# Patient Record
Sex: Male | Born: 1990 | Race: White | Hispanic: No | Marital: Single | Smoking: Never smoker
Health system: Southern US, Community
[De-identification: ages and names within clinical notes are randomized; demographics above are authoritative.]

## PROBLEM LIST (undated history)

## (undated) DIAGNOSIS — I83891 Varicose veins of right lower extremities with other complications: Secondary | ICD-10-CM

## (undated) HISTORY — PX: LASER ABLATION: SHX1947

## (undated) HISTORY — DX: Varicose veins of right lower extremity with other complications: I83.891

---

## 2009-06-02 ENCOUNTER — Emergency Department (HOSPITAL_COMMUNITY): Admission: EM | Admit: 2009-06-02 | Discharge: 2009-06-03 | Payer: Self-pay | Admitting: Emergency Medicine
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
## 2016-05-22 ENCOUNTER — Encounter: Payer: Self-pay | Admitting: Vascular Surgery

## 2016-06-01 ENCOUNTER — Telehealth: Payer: Self-pay | Admitting: Vascular Surgery

## 2016-06-01 ENCOUNTER — Encounter: Payer: Managed Care, Other (non HMO) | Admitting: Vascular Surgery

## 2016-06-01 NOTE — Telephone Encounter (Signed)
Per [employer] instruction, I called patient and left message for him to arrive at 1:30pm for his appt. Dr.Lawson has a meeting this afternoon and has asked that his patients come in earlier if possible. awt

## 2016-06-11 ENCOUNTER — Ambulatory Visit (HOSPITAL_COMMUNITY)
Admission: RE | Admit: 2016-06-11 | Discharge: 2016-06-11 | Disposition: A | Discharge status: Home / Self Care | Payer: Managed Care, Other (non HMO) | Source: Ambulatory Visit | Attending: Vascular Surgery | Admitting: Vascular Surgery

## 2016-06-11 ENCOUNTER — Encounter: Payer: Self-pay | Admitting: Vascular Surgery

## 2016-06-11 ENCOUNTER — Other Ambulatory Visit: Payer: Self-pay | Admitting: *Deleted

## 2016-06-11 ENCOUNTER — Ambulatory Visit (INDEPENDENT_AMBULATORY_CARE_PROVIDER_SITE_OTHER): Payer: Managed Care, Other (non HMO) | Admitting: Vascular Surgery

## 2016-06-11 VITALS — BP 130/81 | HR 67 | Temp 97.1°F | Resp 16 | Ht 72.0 in | Wt 186.0 lb

## 2016-06-11 DIAGNOSIS — I83899 Varicose veins of unspecified lower extremities with other complications: Secondary | ICD-10-CM | POA: Diagnosis not present

## 2016-06-11 DIAGNOSIS — I83811 Varicose veins of right lower extremities with pain: Secondary | ICD-10-CM | POA: Insufficient documentation
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
## 2016-06-16 ENCOUNTER — Other Ambulatory Visit: Payer: Self-pay | Admitting: *Deleted

## 2016-06-16 DIAGNOSIS — I83811 Varicose veins of right lower extremities with pain: Secondary | ICD-10-CM

## 2016-06-24 ENCOUNTER — Encounter: Payer: Self-pay | Admitting: Vascular Surgery

## 2016-06-25 ENCOUNTER — Encounter: Payer: Self-pay | Admitting: Vascular Surgery

## 2016-07-02 ENCOUNTER — Other Ambulatory Visit: Payer: Managed Care, Other (non HMO) | Admitting: Vascular Surgery

## 2016-07-07 ENCOUNTER — Ambulatory Visit: Payer: Managed Care, Other (non HMO) | Admitting: Vascular Surgery

## 2016-07-07 ENCOUNTER — Encounter (HOSPITAL_COMMUNITY): Payer: Managed Care, Other (non HMO)

## 2016-08-06 ENCOUNTER — Ambulatory Visit (INDEPENDENT_AMBULATORY_CARE_PROVIDER_SITE_OTHER): Payer: Managed Care, Other (non HMO) | Admitting: Vascular Surgery

## 2016-08-06 ENCOUNTER — Encounter: Payer: Self-pay | Admitting: Vascular Surgery

## 2016-08-06 VITALS — BP 136/85 | HR 78 | Temp 98.0°F | Resp 16 | Ht 72.0 in | Wt 180.0 lb

## 2016-08-06 DIAGNOSIS — I83899 Varicose veins of unspecified lower extremities with other complications: Secondary | ICD-10-CM

## 2016-08-06 DIAGNOSIS — I868 Varicose veins of other specified sites: Secondary | ICD-10-CM

## 2016-08-06 NOTE — Progress Notes (Signed)
     Laser Ablation Procedure    Date: 08/06/2016   Vincente PoliSamuel B Tow DOB:09-11-1990  Consent signed: Yes    Surgeon:  Dr. Tawanna Coolerodd Nevan Creighton  Procedure: Laser Ablation: right Small Saphenous Vein  BP 136/85   Pulse 78   Temp 98 F (36.7 C)   Resp 16   Ht 6' (1.829 m)   Wt 180 lb (81.6 kg)   SpO2 99%   BMI 24.41 kg/m   Tumescent Anesthesia: 380 cc 0.9% NaCl with 50 cc Lidocaine HCL with 1% Epi and 15 cc 8.4% NaHCO3  Local Anesthesia: 4 cc Lidocaine HCL and NaHCO3 (ratio 2:1)  15 watts continuous mode        Total energy: 1550   Total time: 1:43    Stab Phlebectomy: >20 Sites: Calf  Patient tolerated procedure well  Notes:   Description of Procedure:  After marking the course of the secondary varicosities, the patient was placed on the operating table in the prone position, and the right leg was prepped and draped in sterile fashion.   Local anesthetic was administered and under ultrasound guidance the saphenous vein was accessed with a micro needle and guide wire; then the mirco puncture sheath was placed.  A guide wire was inserted saphenopopliteal junction , followed by a 5 french sheath.  The position of the sheath and then the laser fiber below the junction was confirmed using the ultrasound.  Tumescent anesthesia was administered along the course of the saphenous vein using ultrasound guidance. The patient was placed in Trendelenburg position and protective laser glasses were placed on patient and staff, and the laser was fired at 15 watts continuous mode advancing 1-602mm/second for a total of 1550 joules.   For stab phlebectomies, local anesthetic was administered at the previously marked varicosities, and tumescent anesthesia was administered around the vessels.  Greater than 20 stab wounds were made using the tip of an 11 blade. And using the vein hook, the phlebectomies were performed using a hemostat to avulse the varicosities.  Adequate hemostasis was achieved.      Steri strips were applied to the stab wounds and ABD pads and thigh high compression stockings were applied.  Ace wrap bandages were applied over the phlebectomy sites and at the top of the saphenopopliteal junction. Blood loss was less than 15 cc.  The patient ambulated out of the operating room having tolerated the procedure well.

## 2016-08-07 ENCOUNTER — Encounter: Payer: Self-pay | Admitting: Vascular Surgery

## 2016-08-13 ENCOUNTER — Ambulatory Visit (HOSPITAL_COMMUNITY)
Admission: RE | Admit: 2016-08-13 | Discharge: 2016-08-13 | Disposition: A | Discharge status: Home / Self Care | Payer: Managed Care, Other (non HMO) | Source: Ambulatory Visit | Attending: Vascular Surgery | Admitting: Vascular Surgery

## 2016-08-13 ENCOUNTER — Encounter: Payer: Self-pay | Admitting: Vascular Surgery

## 2016-08-13 ENCOUNTER — Ambulatory Visit (INDEPENDENT_AMBULATORY_CARE_PROVIDER_SITE_OTHER): Payer: Managed Care, Other (non HMO) | Admitting: Vascular Surgery

## 2016-08-13 VITALS — BP 123/81 | HR 77 | Temp 97.9°F | Resp 20 | Ht 72.0 in | Wt 184.5 lb

## 2016-08-13 DIAGNOSIS — I83811 Varicose veins of right lower extremities with pain: Secondary | ICD-10-CM | POA: Diagnosis not present

## 2016-08-13 DIAGNOSIS — I83899 Varicose veins of unspecified lower extremities with other complications: Secondary | ICD-10-CM

## 2016-08-13 NOTE — Progress Notes (Signed)
   Patient name: Taylor Calhoun Okuda MRN: [id number] DOB: 03/20/1991 Sex: male  REASON FOR VISIT: One-week follow-up right small saphenous vein ablation and stab phlebectomy of painful tributary varicosities  HPI: Taylor Calhoun Willems is a 26 y.o. male here for follow-up. He has been compliant with his compression garment. He does have the usual amount of discomfort in the ablation site.  No current outpatient prescriptions on file.   No current facility-administered medications for this visit.      PHYSICAL EXAM: Vitals:   08/13/16 0956  BP: 123/81  Pulse: 77  Resp: 20  Temp: 97.9 F (36.6 C)  TempSrc: Oral  SpO2: 98%  Weight: 184 lb 8 oz (83.7 kg)  Height: 6' (1.829 m)    GENERAL: The patient is a well-nourished male, in no acute distress. The vital signs are documented above. Minimal bruising. Typical thickening at the phlebectomy sites. No distal swelling  Duplex reveals closure of his small saphenous vein from mid calf to 2 half centimeters from the saphenous popliteal junction. No evidence of DVT  MEDICAL ISSUES: Excellent Tambra Muller result from ablation of small saphenous vein and stab phlebectomy for painful varicosities. He wears compression for one additional week and then on as-needed basis. He will notify should he develop any difficulty in the future otherwise be seen on as-needed basis   Larina Earthlyodd F. Preslynn Bier, MD [facility] Office Tel [phone] Pager [phone]

## 2016-12-16 ENCOUNTER — Ambulatory Visit: Payer: Managed Care, Other (non HMO) | Admitting: *Deleted

## 2018-09-01 ENCOUNTER — Other Ambulatory Visit: Payer: Self-pay | Admitting: *Deleted

## 2018-09-01 ENCOUNTER — Telehealth: Payer: Self-pay | Admitting: *Deleted

## 2018-09-01 DIAGNOSIS — I83811 Varicose veins of right lower extremities with pain: Secondary | ICD-10-CM

## 2018-09-01 NOTE — Telephone Encounter (Signed)
Returning telephone voice message from Taylor Calhoun.  He is an established patient of Dr. Donnetta Hutching with history of endovenous laser ablation right small saphenous vein and stab phlebectomy > 20 incisions right leg on 08-06-2016.  Per voice message, Taylor Calhoun states he is experiencing "throbbing, burning sensation on the  inside of my right calf" and "pinching feeling over bulging vein in my right leg."  I was unable to talk with Taylor Calhoun but left him a detailed telephone voice message that VVS schedulers would call him to set up a venous reflux study of the right leg and a VV appointment with Dr. Scot Dock or Dr. Oneida Alar.

## 2018-09-08 ENCOUNTER — Telehealth: Payer: Self-pay | Admitting: *Deleted

## 2018-09-12 ENCOUNTER — Telehealth (HOSPITAL_COMMUNITY): Payer: Self-pay | Admitting: *Deleted

## 2018-09-12 NOTE — Telephone Encounter (Signed)
Attempting to reach pt to schedule appointment

## 2018-09-19 ENCOUNTER — Telehealth: Payer: Self-pay | Admitting: *Deleted

## 2018-09-19 ENCOUNTER — Telehealth (HOSPITAL_COMMUNITY): Payer: Self-pay | Admitting: *Deleted

## 2018-09-19 NOTE — Telephone Encounter (Signed)
09/19/18 attempted to reach pt to schedule appointment.  No answer/no msg available

## 2018-10-22 HISTORY — PX: VARICOSE VEIN SURGERY: SHX832

## 2018-10-22 HISTORY — PX: GALLBLADDER SURGERY: SHX652

## 2019-01-09 ENCOUNTER — Ambulatory Visit (INDEPENDENT_AMBULATORY_CARE_PROVIDER_SITE_OTHER): Payer: Managed Care, Other (non HMO) | Admitting: Family Medicine

## 2019-01-09 ENCOUNTER — Other Ambulatory Visit: Payer: Self-pay

## 2019-01-09 ENCOUNTER — Encounter: Payer: Self-pay | Admitting: Family Medicine

## 2019-01-09 DIAGNOSIS — J988 Other specified respiratory disorders: Secondary | ICD-10-CM

## 2019-01-09 DIAGNOSIS — R05 Cough: Secondary | ICD-10-CM | POA: Diagnosis not present

## 2019-01-09 DIAGNOSIS — R059 Cough, unspecified: Secondary | ICD-10-CM

## 2019-01-09 MED ORDER — AZITHROMYCIN 250 MG PO TABS: ORAL_TABLET | ORAL | 0 refills | Status: AC

## 2019-01-09 NOTE — Progress Notes (Signed)
Patient ID: Taylor Calhoun, male   DOB: 1990/12/03, 28 y.o.   MRN: [id number]    Virtual Visit via video Note  This visit type was conducted due to national recommendations for restrictions regarding the COVID-19 pandemic (e.g. social distancing).  This format is felt to be most appropriate for this patient at this time.  All issues noted in this document were discussed and addressed.  No physical exam was performed (except for noted visual exam findings with Video Visits).   I connected with Taylor Calhoun today at  2:20 PM EDT by a video enabled telemedicine application and verified that I am speaking with the correct person using two identifiers. Location patient: home Location provider: work or home office Persons participating in the virtual visit: patient, provider  I discussed the limitations, risks, security and privacy concerns of performing an evaluation and management service by video and the availability of in person appointments. I also discussed with the patient that there may be a patient responsible charge related to this service. The patient expressed understanding and agreed to proceed.  HPI:  Patient and I connected via video practice establish primary care.  Main concern is cough congestion and a respiratory illness he has had for a little over a week.  He has been tested x2 for COVID-19, one rapid in 1 send out, both were negative.  Continues to have cough congestion and a little bit of shortness of breath.  Has used an albuterol inhaler twice yesterday, but none so far today.  Shortness of breath is slightly improving.  Phlegm at first it was green and yellow, today seems white but is still thick.  Denies chest pain, body aches, shortness of breath or wheezing.  Denies any fever today, but states did have a temperature of 99.9 on Saturday, 01/07/2019   ROS: See pertinent positives and negatives per HPI.  Past Medical History:  Diagnosis Date  . Varicose veins of right  leg with edema     Past Surgical History:  Procedure Laterality Date  . GALLBLADDER SURGERY  10/2018  . LASER ABLATION    . VARICOSE VEIN SURGERY  10/2018    Family History  Problem Relation Age of Onset  . Hypertension Father    Social History   Tobacco Use  . Smoking status: Never Smoker  . Smokeless tobacco: Former Neurosurgeon    Types: Snuff  Substance Use Topics  . Alcohol use: Yes    Alcohol/week: 3.0 standard drinks    Types: 3 Cans of beer per week     EXAM:  GENERAL: alert, oriented, appears well and in no acute distress  HEENT: atraumatic, conjunttiva clear, no obvious abnormalities on inspection of external nose and ears  NECK: normal movements of the head and neck  LUNGS: on inspection no signs of respiratory distress, breathing rate appears normal, no obvious gross SOB, gasping or wheezing  CV: no obvious cyanosis  MS: moves all visible extremities without noticeable abnormality  PSYCH/NEURO: pleasant and cooperative, no obvious depression or anxiety, speech and thought processing grossly intact  ASSESSMENT AND PLAN:  Discussed the following assessment and plan:  Respiratory infection/cough - suspect patient has some sort of respiratory infection going on, Covid testing is negative.  Due to still having cough congestion and some shortness of breath we will have patient remain out of work to err on side of caution.  I will send in azithromycin course to cover for possible bacterial component of respiratory illness.  Advised to use  albuterol at least twice a day for next 5 to 7 days to help open up the lungs and also if needed.  Advised to continue cough syrup that was prescribed by urgent care, get plenty of rest and drink lots of fluids.  I discussed the assessment and treatment plan with the patient. The patient was provided an opportunity to ask questions and all were answered. The patient agreed with the plan and demonstrated an understanding of the  instructions.   The patient was advised to call back or seek an in-person evaluation if the symptoms worsen or if the condition fails to improve as anticipated.  Work note released to [employer].  Patient will come in the clinic for physical with blood work at some point over the end of the calendar year.  Patient aware he cannot come into clinic for at least 30 days after being tested for COVID-19  Jodelle Green, FNP

## 2019-01-24 ENCOUNTER — Telehealth: Payer: Self-pay | Admitting: Family Medicine

## 2019-01-24 NOTE — Telephone Encounter (Signed)
Patient checking on the status if FMLA paperwork was received, fax on 01/23/2019 to , patient would like a follow up call paperwork was received, please leave VM. Patient states he established care with Phil[facility] on 01/09/2019.

## 2019-01-24 NOTE — Telephone Encounter (Signed)
I tried to call patient to let him know paperwork was faxed to [employer] had recieved confirmation that fax went through.
